# Patient Record
Sex: Female | Born: 1990 | Race: White | Hispanic: No | Marital: Single | Smoking: Former smoker
Health system: Southern US, Community
[De-identification: ages and names within clinical notes are randomized; demographics above are authoritative.]

---

## 2018-12-28 ENCOUNTER — Emergency Department
Admission: EM | Admit: 2018-12-28 | Discharge: 2018-12-28 | Disposition: A | Discharge status: Home / Self Care | Payer: Self-pay | Source: Home / Self Care | Attending: Family Medicine | Admitting: Family Medicine

## 2018-12-28 ENCOUNTER — Other Ambulatory Visit: Payer: Self-pay

## 2018-12-28 DIAGNOSIS — R0981 Nasal congestion: Secondary | ICD-10-CM | POA: Diagnosis not present

## 2018-12-28 NOTE — Discharge Instructions (Addendum)
Continue antihistamine and pseudoephedrine as needed for congestion

## 2018-12-28 NOTE — ED Provider Notes (Signed)
[facility]    CSN: [id number] Arrival date & time: 12/28/18  1213     History   Chief Complaint Chief Complaint  Patient presents with  . Needs to be cleared for work    HPI Leo RodSamantha Kester is a 28 y.o. female.   Patient has a history of seasonal rhinitis. 6 days ago she developed headache, increased sinus congestion and low grade fever.  Her symptoms rapidly resolved over the next 3 days.  She does not have a cough and now feels well.  The history is provided by the patient.    History reviewed. No pertinent past medical history.  There are no active problems to display for this patient.   History reviewed. No pertinent surgical history.  OB History   No obstetric history on file.      Home Medications    Prior to Admission medications   Not on File    Family History History reviewed. No pertinent family history.  Social History Social History   Tobacco Use  . Smoking status: Not on file  Substance Use Topics  . Alcohol use: Not on file  . Drug use: Not on file     Allergies   Patient has no known allergies.   Review of Systems Review of Systems + sore throat, resolved No cough No pleuritic pain No wheezing + nasal congestion + post-nasal drainage No sinus pain/pressure No itchy/red eyes No earache No hemoptysis No SOB ? fever, No chills No nausea No vomiting No abdominal pain No diarrhea No urinary symptoms No skin rash No fatigue No myalgias No headache Used OTC meds without relief   Physical Exam Triage Vital Signs ED Triage Vitals [12/28/18 1237]  Enc Vitals Group     BP 114/75     Pulse Rate 72     Resp 20     Temp 98 F (36.7 C)     Temp Source Oral     SpO2 99 %     Weight 160 lb (72.6 kg)     Height 5\' 3"  (1.6 m)     Head Circumference      Peak Flow      Pain Score 0     Pain Loc      Pain Edu?      Excl. in GC?    No data found.  Updated Vital Signs BP 114/75 (BP Location: Right Arm)    Pulse 72   Temp 98 F (36.7 C) (Oral)   Resp 20   Ht 5\' 3"  (1.6 m)   Wt 72.6 kg   LMP 12/28/2018   SpO2 99%   BMI 28.34 kg/m   Visual Acuity Right Eye Distance:   Left Eye Distance:   Bilateral Distance:    Right Eye Near:   Left Eye Near:    Bilateral Near:     Physical Exam Nursing notes and Vital Signs reviewed. Appearance:  Patient appears stated age, and in no acute distress Eyes:  Pupils are equal, round, and reactive to light and accomodation.  Extraocular movement is intact.  Conjunctivae are not inflamed  Ears:  Canals normal.  Tympanic membranes scarred but otherwise normal.  Nose:  Mildly congested turbinates.  No sinus tenderness.  Pharynx:  Normal Neck:  Supple.  No adenopathy. Lungs:  Clear to auscultation.  Breath sounds are equal.  Moving air well. Heart:  Regular rate and rhythm without murmurs, rubs, or gallops.  Abdomen:  Nontender without masses or hepatosplenomegaly.  Bowel sounds are present.  No CVA or flank tenderness.  Extremities:  No edema.  Skin:  No rash present.    UC Treatments / Results  Labs (all labs ordered are listed, but only abnormal results are displayed) Labs Reviewed - No data to display  EKG None  Radiology No results found.  Procedures Procedures (including critical care time)  Medications Ordered in UC Medications - No data to display  Initial Impression / Assessment and Plan / UC Course  I have reviewed the triage vital signs and the nursing notes.  Pertinent labs & imaging results that were available during my care of the patient were reviewed by me and considered in my medical decision making (see chart for details).    Patient appears to have had a flare-up of seasonal rhinitis, or minimal viral URI now resolved.   Final Clinical Impressions(s) / UC Diagnoses   Final diagnoses:  Nasal sinus congestion     Discharge Instructions     Continue antihistamine and pseudoephedrine as needed for congestion    ED Prescriptions    None         Lattie Haw, MD 12/28/18 1303

## 2018-12-28 NOTE — ED Triage Notes (Signed)
Pt stated that she needed a note for work.  She had sinus pain and pressure along with a low grade fever on Thursday.  It has resolved, and work needs pt cleared to return.

## 2019-09-20 ENCOUNTER — Other Ambulatory Visit: Payer: Self-pay

## 2019-09-20 ENCOUNTER — Emergency Department
Admission: EM | Admit: 2019-09-20 | Discharge: 2019-09-20 | Disposition: A | Discharge status: Home / Self Care | Payer: BC Managed Care – PPO | Source: Home / Self Care | Attending: Family Medicine | Admitting: Family Medicine

## 2019-09-20 ENCOUNTER — Emergency Department (INDEPENDENT_AMBULATORY_CARE_PROVIDER_SITE_OTHER): Payer: BC Managed Care – PPO

## 2019-09-20 DIAGNOSIS — R079 Chest pain, unspecified: Secondary | ICD-10-CM | POA: Diagnosis not present

## 2019-09-20 DIAGNOSIS — S20219A Contusion of unspecified front wall of thorax, initial encounter: Secondary | ICD-10-CM | POA: Diagnosis not present

## 2019-09-20 NOTE — ED Provider Notes (Signed)
[facility]    CSN: [id number] Arrival date & time: 09/20/19  1523      History   Chief Complaint Chief Complaint  Patient presents with  . Chest Pain    HPI Jody Davidson is a 29 y.o. female.   Patient reports that her 80 pound daughter jumped into her lap 3 days ago, bumping patient's chest.  She complains of persistent pain in her upper anterior chest worse with movement and inspiration.  She denies shortness of breath.  The history is provided by the patient.    History reviewed. No pertinent past medical history.  There are no problems to display for this patient.   History reviewed. No pertinent surgical history.  OB History   No obstetric history on file.      Home Medications    Prior to Admission medications   Not on File    Family History History reviewed. No pertinent family history.  Social History Social History   Tobacco Use  . Smoking status: Current Every Day Smoker    Types: Cigarettes  . Smokeless tobacco: Never Used  . Tobacco comment: trying to quit  Substance Use Topics  . Alcohol use: Not Currently  . Drug use: Not Currently     Allergies   Patient has no known allergies.   Review of Systems Review of Systems  Constitutional: Negative for activity change, appetite change, chills, diaphoresis, fatigue and fever.  Respiratory: Positive for chest tightness. Negative for cough, shortness of breath, wheezing and stridor.   Cardiovascular: Negative for palpitations.  All other systems reviewed and are negative.    Physical Exam Triage Vital Signs ED Triage Vitals  Enc Vitals Group     BP 09/20/19 1540 118/73     Pulse Rate 09/20/19 1540 90     Resp 09/20/19 1540 20     Temp 09/20/19 1540 98.4 F (36.9 C)     Temp Source 09/20/19 1540 Oral     SpO2 09/20/19 1540 100 %     Weight 09/20/19 1542 164 lb (74.4 kg)     Height 09/20/19 1542 5\' 3"  (1.6 m)     Head Circumference --      Peak Flow --      Pain  Score 09/20/19 1541 7     Pain Loc --      Pain Edu? --      Excl. in GC? --    No data found.  Updated Vital Signs BP 118/73 (BP Location: Right Arm)   Pulse 90   Temp 98.4 F (36.9 C) (Oral)   Resp 20   Ht 5\' 3"  (1.6 m)   Wt 74.4 kg   SpO2 100%   BMI 29.05 kg/m   Visual Acuity Right Eye Distance:   Left Eye Distance:   Bilateral Distance:    Right Eye Near:   Left Eye Near:    Bilateral Near:     Physical Exam Vitals and nursing note reviewed.  Constitutional:      General: She is not in acute distress.    Appearance: She is not ill-appearing.  HENT:     Head: Atraumatic.     Nose: Nose normal.  Eyes:     Pupils: Pupils are equal, round, and reactive to light.  Cardiovascular:     Rate and Rhythm: Normal rate and regular rhythm.     Heart sounds: Normal heart sounds.  Pulmonary:     Effort: No respiratory distress.  Breath sounds: Normal breath sounds.  Chest:     Chest wall: Tenderness present.       Comments: Distinct tenderness left upper chest lateral to sternum.  No ecchymosis or swelling. Abdominal:     Palpations: Abdomen is soft.     Tenderness: There is no abdominal tenderness.  Musculoskeletal:     Cervical back: Normal range of motion.     Right lower leg: No edema.  Neurological:     Mental Status: She is alert.      UC Treatments / Results  Labs (all labs ordered are listed, but only abnormal results are displayed) Labs Reviewed - No data to display  EKG   Radiology DG Ribs Unilateral W/Chest Left  Result Date: 09/20/2019 CLINICAL DATA:  Status post trauma 3 days ago. EXAM: LEFT RIBS AND CHEST - 3+ VIEW COMPARISON:  None. FINDINGS: No fracture or other bone lesions are seen involving the ribs. A small radiopaque marker is seen at the site of the patient's pain. There is no evidence of pneumothorax or pleural effusion. Both lungs are clear. Heart size and mediastinal contours are within normal limits. IMPRESSION: Negative.  Electronically Signed   By: Virgina Norfolk M.D.   On: 09/20/2019 17:31    Procedures Procedures (including critical care time)  Medications Ordered in UC Medications - No data to display  Initial Impression / Assessment and Plan / UC Course  I have reviewed the triage vital signs and the nursing notes.  Pertinent labs & imaging results that were available during my care of the patient were reviewed by me and considered in my medical decision making (see chart for details).    Normal x-ray reassuring. Followup with Dr. Aundria Mems ([facility]) if not improving about two weeks.   Final Clinical Impressions(s) / UC Diagnoses   Final diagnoses:  Contusion of chest wall, initial encounter     Discharge Instructions     Apply ice pack for 20 to 30 minutes, 3 to 4 times daily  Continue until pain and swelling decrease.  May take Ibuprofen 200mg , 4 tabs every 8 hours with food.  May take Tylenol at bedtime if needed for pain.    ED Prescriptions    None        Kandra Nicolas, MD 09/21/19 2329

## 2019-09-20 NOTE — ED Triage Notes (Signed)
Pt states that on Saturday her oldest daughter jumped in her lap, and she is not sure if she pulled a muscle in her chest.  It has been spasming and twitching since

## 2019-09-20 NOTE — Discharge Instructions (Signed)
Apply ice pack for 20 to 30 minutes, 3 to 4 times daily  Continue until pain and swelling decrease.  May take Ibuprofen 200mg , 4 tabs every 8 hours with food.  May take Tylenol at bedtime if needed for pain.

## 2020-05-05 ENCOUNTER — Emergency Department
Admission: EM | Admit: 2020-05-05 | Discharge: 2020-05-05 | Disposition: A | Discharge status: Home / Self Care | Payer: BC Managed Care – PPO | Source: Home / Self Care

## 2020-05-05 ENCOUNTER — Other Ambulatory Visit: Payer: Self-pay

## 2020-05-05 DIAGNOSIS — N3001 Acute cystitis with hematuria: Secondary | ICD-10-CM | POA: Diagnosis not present

## 2020-05-05 LAB — POCT URINALYSIS DIP (MANUAL ENTRY)
Bilirubin, UA: NEGATIVE
Glucose, UA: NEGATIVE mg/dL
Ketones, POC UA: NEGATIVE mg/dL
Nitrite, UA: NEGATIVE
Protein Ur, POC: NEGATIVE mg/dL
Spec Grav, UA: 1.01 (ref 1.010–1.025)
Urobilinogen, UA: 0.2 E.U./dL
pH, UA: 6.5 (ref 5.0–8.0)

## 2020-05-05 MED ORDER — PHENAZOPYRIDINE HCL 200 MG PO TABS: 200.0000 mg | ORAL_TABLET | Freq: Three times a day (TID) | ORAL | 0 refills | Status: AC | PRN

## 2020-05-05 MED ORDER — CEPHALEXIN 500 MG PO CAPS: 500.0000 mg | ORAL_CAPSULE | Freq: Three times a day (TID) | ORAL | 0 refills | Status: AC

## 2020-05-05 NOTE — ED Triage Notes (Signed)
Patient presents to Urgent Care with complaints of pain w/ urination and this morning she saw blood in her urine. Patient reports she does not think she is on her menstrual, but has an IUD so her periods are not regular.

## 2020-05-05 NOTE — ED Provider Notes (Signed)
[facility]    CSN: [id number] Arrival date & time: 05/05/20  1029      History   Chief Complaint Chief Complaint  Patient presents with  . Urinary Tract Infection    HPI Jody Davidson is a 29 y.o. female.   Established [facility] patient  Patient presents to Urgent Care with complaints of pain w/ urination and this morning she saw blood in her urine. Patient reports she does not think she is on her menstrual, but has an IUD so her periods are not regular.      History reviewed. No pertinent past medical history.  There are no problems to display for this patient.   History reviewed. No pertinent surgical history.  OB History   No obstetric history on file.      Home Medications    Prior to Admission medications   Medication Sig Start Date End Date Taking? Authorizing Provider  cabergoline (DOSTINEX) 0.5 MG tablet Take 0.25 mg by mouth 2 (two) times a week. Take one half tablet two times per week   Yes [provider]  cephALEXin (KEFLEX) 500 MG capsule Take 1 capsule (500 mg total) by mouth 3 (three) times daily. 05/05/20   Elvina Sidle, MD  phenazopyridine (PYRIDIUM) 200 MG tablet Take 1 tablet (200 mg total) by mouth 3 (three) times daily as needed for pain. 05/05/20   Elvina Sidle, MD    Family History Family History  Family history unknown: Yes    Social History Social History   Tobacco Use  . Smoking status: Former Smoker    Types: Cigarettes    Quit date: 04/04/2020    Years since quitting: 0.0  . Smokeless tobacco: Never Used  Substance Use Topics  . Alcohol use: Not Currently  . Drug use: Not Currently     Allergies   Patient has no known allergies.   Review of Systems Review of Systems  Constitutional: Negative.   HENT: Positive for congestion.   Respiratory: Positive for cough.   Genitourinary: Positive for dysuria and hematuria.  Musculoskeletal: Positive for back pain.  All other systems reviewed and are  negative.    Physical Exam Triage Vital Signs ED Triage Vitals  Enc Vitals Group     BP 05/05/20 1058 106/66     Pulse Rate 05/05/20 1058 74     Resp 05/05/20 1058 16     Temp 05/05/20 1058 98.7 F (37.1 C)     Temp Source 05/05/20 1058 Oral     SpO2 05/05/20 1058 99 %     Weight --      Height --      Head Circumference --      Peak Flow --      Pain Score 05/05/20 1054 4     Pain Loc --      Pain Edu? --      Excl. in GC? --    No data found.  Updated Vital Signs BP 106/66 (BP Location: Right Arm)   Pulse 74   Temp 98.7 F (37.1 C) (Oral)   Resp 16   SpO2 99%    Physical Exam Vitals reviewed.  Constitutional:      Appearance: Normal appearance. She is normal weight.  HENT:     Head: Normocephalic.  Pulmonary:     Effort: Pulmonary effort is normal.  Musculoskeletal:        General: Normal range of motion.     Cervical back: Normal range of motion  and neck supple.  Skin:    General: Skin is warm and dry.  Neurological:     General: No focal deficit present.     Mental Status: She is alert.  Psychiatric:        Mood and Affect: Mood normal.        Behavior: Behavior normal.      UC Treatments / Results  Labs (all labs ordered are listed, but only abnormal results are displayed) Labs Reviewed  POCT URINALYSIS DIP (MANUAL ENTRY) - Abnormal; Notable for the following components:      Result Value   Color, UA yellow (*)    Blood, UA large (*)    Leukocytes, UA Small (1+) (*)    All other components within normal limits    EKG   Radiology No results found.  Procedures Procedures (including critical care time)  Medications Ordered in UC Medications - No data to display  Initial Impression / Assessment and Plan / UC Course  I have reviewed the triage vital signs and the nursing notes.  Pertinent labs & imaging results that were available during my care of the patient were reviewed by me and considered in my medical decision making (see  chart for details).    Final Clinical Impressions(s) / UC Diagnoses   Final diagnoses:  Acute cystitis with hematuria   Discharge Instructions   None    ED Prescriptions    Medication Sig Dispense Auth. Provider   cephALEXin (KEFLEX) 500 MG capsule Take 1 capsule (500 mg total) by mouth 3 (three) times daily. 20 capsule Elvina Sidle, MD   phenazopyridine (PYRIDIUM) 200 MG tablet Take 1 tablet (200 mg total) by mouth 3 (three) times daily as needed for pain. 10 tablet Elvina Sidle, MD     I have reviewed the PDMP during this encounter.   Elvina Sidle, MD 05/05/20 1119

## 2020-05-05 NOTE — Discharge Instructions (Addendum)
If cough or sinus symptoms continue, consider getting Covid test

## 2021-01-24 IMAGING — DX DG RIBS W/ CHEST 3+V*L*
3 series · 3 of 3 positions shown · non-contrast
Comparison: None.

CLINICAL DATA: Status post trauma 3 days ago.

EXAM:
LEFT RIBS AND CHEST - 3+ VIEW

[chest pa]
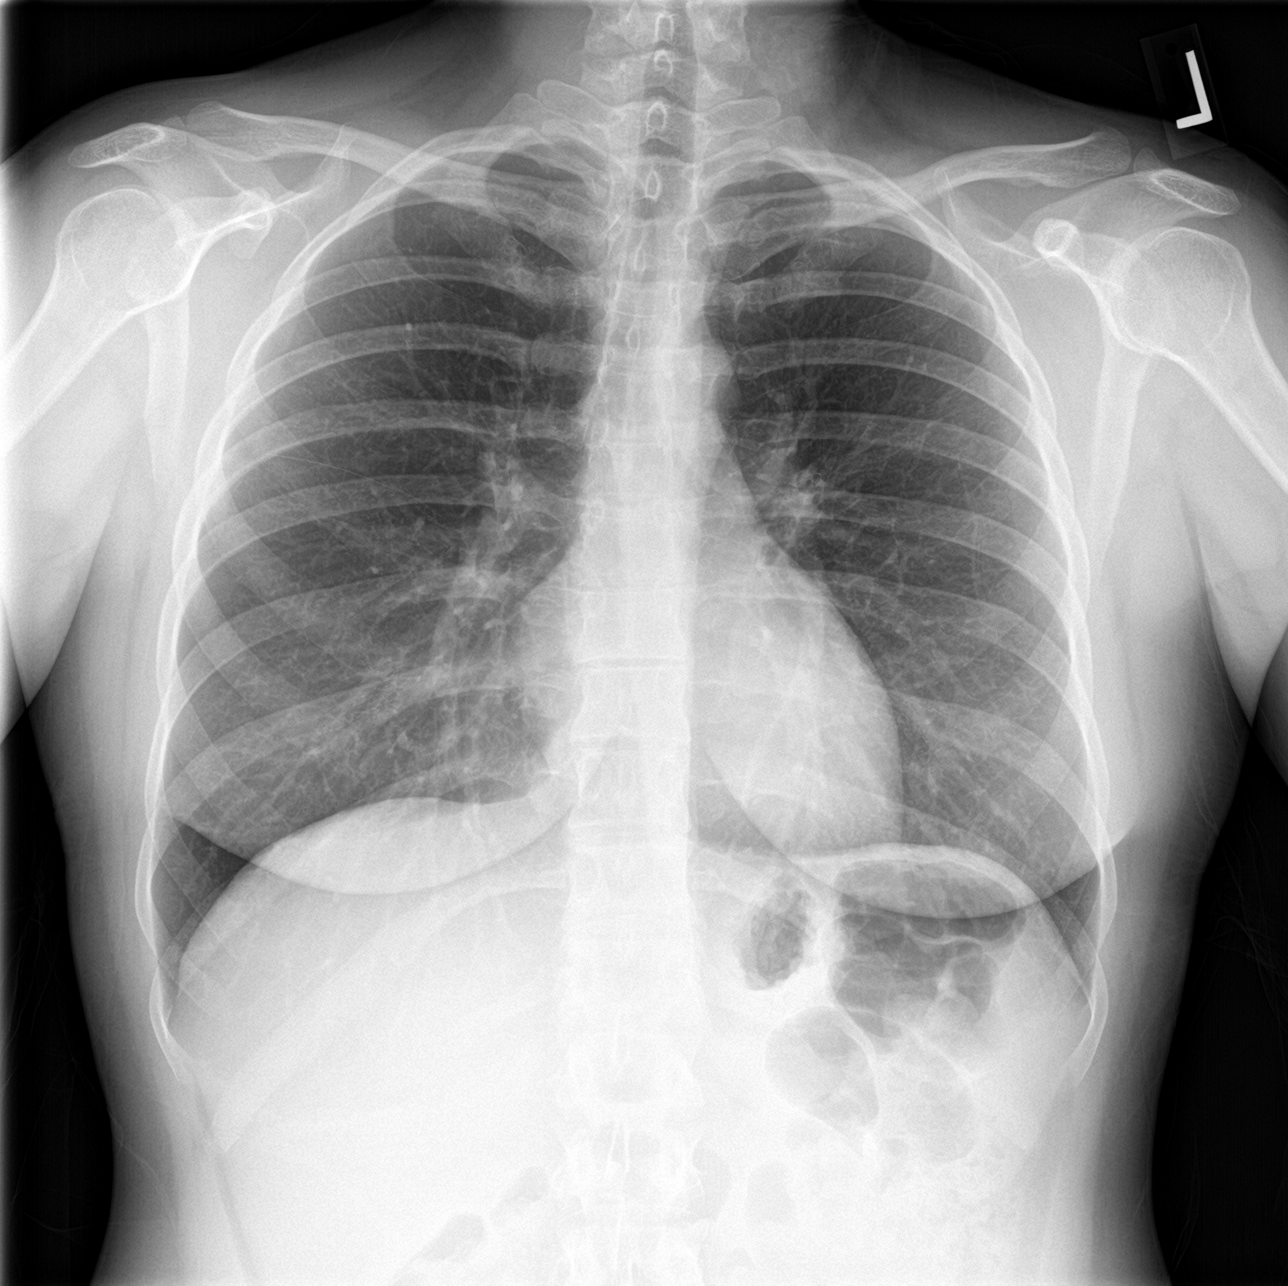

[rib pa]
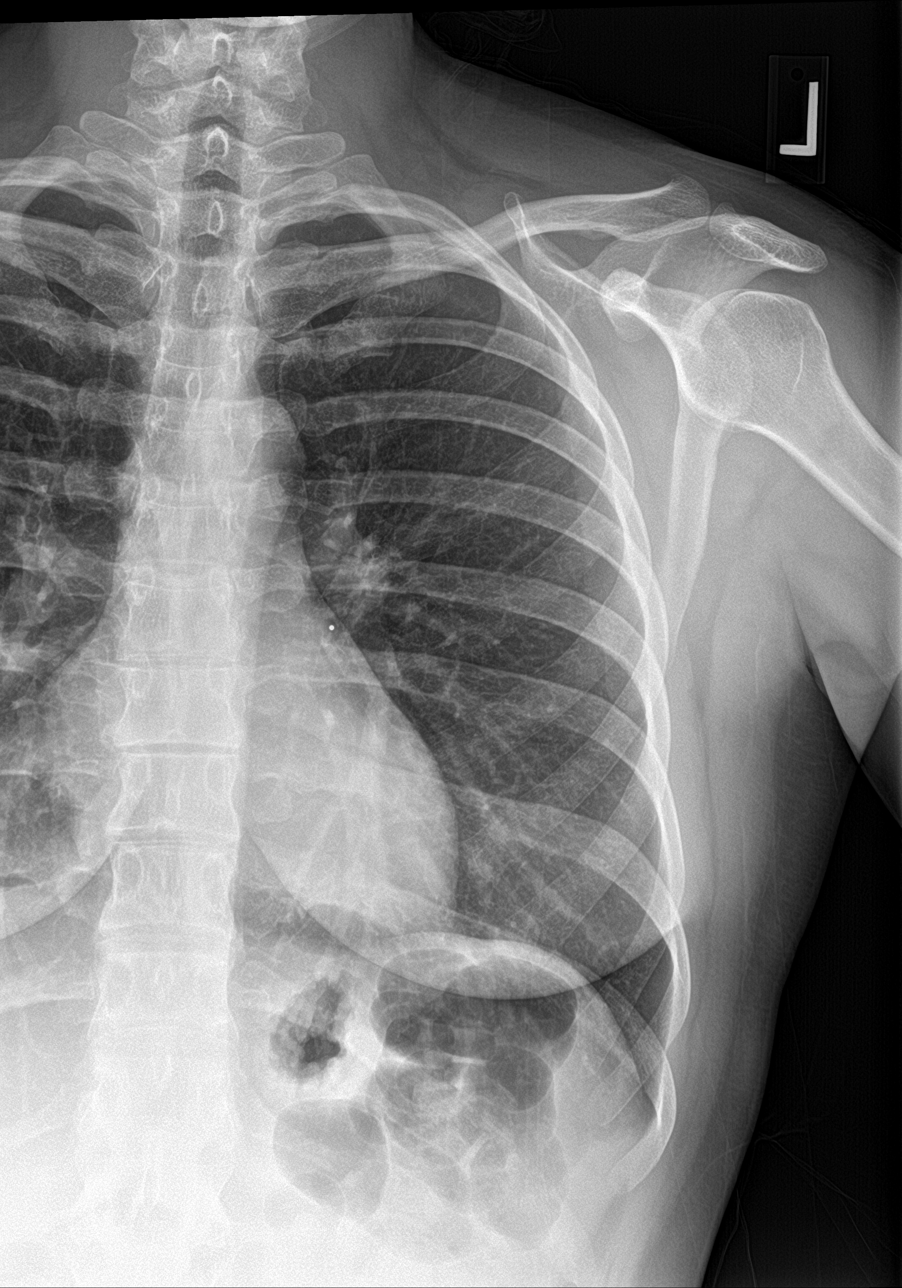

[rib pa obl]
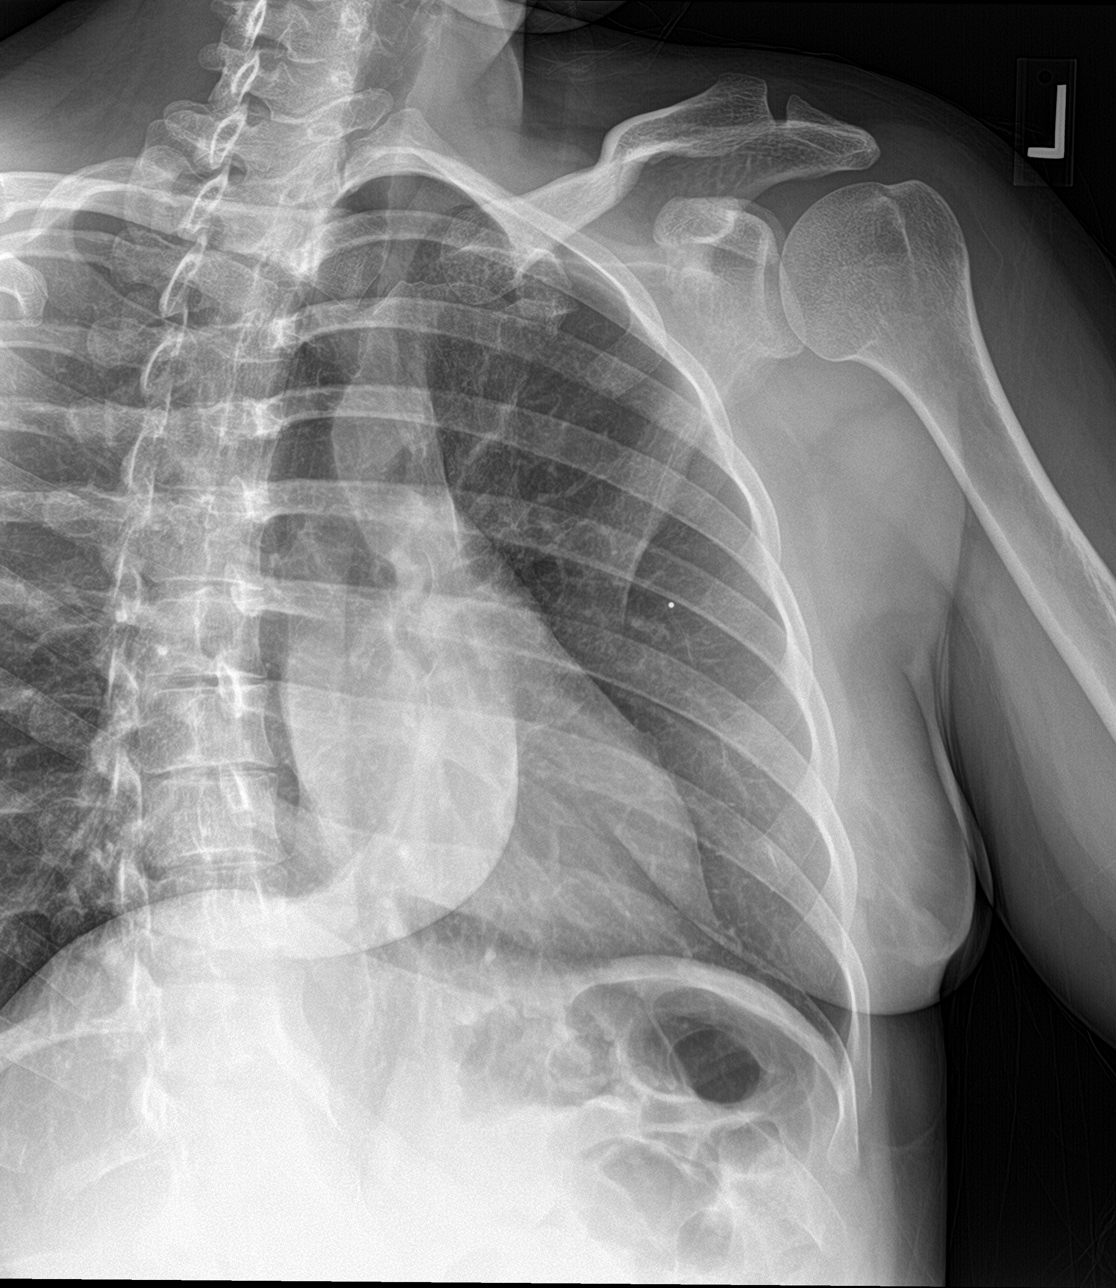

[3 of 3 positions shown; findings below may reference images not displayed]

FINDINGS: No fracture or other bone lesions are seen involving the ribs. A
small radiopaque marker is seen at the site of the patient's pain.
There is no evidence of pneumothorax or pleural effusion. Both lungs
are clear. Heart size and mediastinal contours are within normal
limits.
IMPRESSION: Negative.

## 2024-05-30 ENCOUNTER — Ambulatory Visit: Payer: Self-pay | Admitting: Family Medicine

## 2024-06-13 ENCOUNTER — Encounter: Payer: Self-pay | Admitting: Family Medicine

## 2024-06-13 ENCOUNTER — Ambulatory Visit (INDEPENDENT_AMBULATORY_CARE_PROVIDER_SITE_OTHER): Payer: Self-pay | Admitting: Family Medicine

## 2024-06-13 VITALS — BP 103/64 | HR 71 | Ht 63.0 in | Wt 191.8 lb

## 2024-06-13 DIAGNOSIS — Z7689 Persons encountering health services in other specified circumstances: Secondary | ICD-10-CM

## 2024-06-13 DIAGNOSIS — Z87898 Personal history of other specified conditions: Secondary | ICD-10-CM

## 2024-06-13 DIAGNOSIS — N643 Galactorrhea not associated with childbirth: Secondary | ICD-10-CM

## 2024-06-14 NOTE — Progress Notes (Signed)
 New Patient Office Visit  Subjective    Patient ID: Jody Davidson, female    DOB: 04/06/1991  Age: 33 y.o. MRN: [id number]  CC:  Chief Complaint  Patient presents with   Establish Care    HPI Jody Davidson presents to establish care. She has just moved to the area. She has a history of a pituitary tumor and had a recent workup that was unremarkable but she continues to have galactorrhea. At this point she defers follow up of her sx.    Outpatient Encounter Medications as of 06/13/2024  Medication Sig   cabergoline (DOSTINEX) 0.5 MG tablet Take 0.25 mg by mouth 2 (two) times a week. Take one half tablet two times per week (Patient not taking: Reported on 06/13/2024)   cephALEXin  (KEFLEX ) 500 MG capsule Take 1 capsule (500 mg total) by mouth 3 (three) times daily. (Patient not taking: Reported on 06/13/2024)   phenazopyridine  (PYRIDIUM ) 200 MG tablet Take 1 tablet (200 mg total) by mouth 3 (three) times daily as needed for pain. (Patient not taking: Reported on 06/13/2024)   No facility-administered encounter medications on file as of 06/13/2024.    History reviewed. No pertinent past medical history.  History reviewed. No pertinent surgical history.  Family History  Family history unknown: Yes    Social History   Socioeconomic History   Marital status: Single    Spouse name: Not on file   Number of children: Not on file   Years of education: Not on file   Highest education level: Not on file  Occupational History   Not on file  Tobacco Use   Smoking status: Former    Current packs/day: 0.00    Types: Cigarettes    Quit date: 04/04/2020    Years since quitting: 4.1   Smokeless tobacco: Never  Substance and Sexual Activity   Alcohol use: Not Currently   Drug use: Not Currently   Sexual activity: Not on file  Other Topics Concern   Not on file  Social History Narrative   Not on file   Social Drivers of Health   Financial Resource Strain: Low Risk   (04/18/2021)   Received from [facility]   Overall Financial Resource Strain (CARDIA)    Difficulty of Paying Living Expenses: Not hard at all  Food Insecurity: No Food Insecurity (10/16/2021)   Received from [facility]   Hunger Vital Sign    Within the past 12 months, you worried that your food would run out before you got the money to buy more.: Never true    Within the past 12 months, the food you bought just didn't last and you didn't have money to get more.: Never true  Transportation Needs: No Transportation Needs (04/18/2021)   Received from [facility] - Transportation    Lack of Transportation (Medical): No    Lack of Transportation (Non-Medical): No  Physical Activity: Sufficiently Active (04/18/2021)   Received from [facility]   Exercise Vital Sign    On average, how many days per week do you engage in moderate to strenuous exercise (like a brisk walk)?: 5 days    On average, how many minutes do you engage in exercise at this level?: 60 min  Stress: No Stress Concern Present (04/18/2021)   Received from [facility] - Occupational Stress Questionnaire    Feeling of Stress : Not at all  Social Connections: Unknown (12/24/2021)   Received from  [facility]   Social Network    Social Network: Not on file  Intimate Partner Violence: Unknown (11/28/2021)   Received from [facility]   HITS    Physically Hurt: Not on file    Insult or Talk Down To: Not on file    Threaten Physical Harm: Not on file    Scream or Curse: Not on file    Review of Systems  All other systems reviewed and are negative.       Objective   BP 103/64   Pulse 71   Ht 5' 3 (1.6 m)   Wt 191 lb 12.8 oz (87 kg)   SpO2 98%   BMI 33.98 kg/m   Physical Exam Vitals and nursing note reviewed.  Constitutional:      General: She is not in acute distress. Cardiovascular:     Rate and Rhythm: Normal rate and regular rhythm.  Pulmonary:      Effort: Pulmonary effort is normal.     Breath sounds: Normal breath sounds.  Neurological:     General: No focal deficit present.     Mental Status: She is alert and oriented to person, place, and time.         Assessment & Plan:   Galactorrhea  History of pituitary tumor  Encounter to establish care     Return in about 6 months (around 12/12/2024) for follow up.   Tanda Raguel SQUIBB, MD

## 2024-12-09 ENCOUNTER — Ambulatory Visit: Admitting: Family Medicine
